# Patient Record
Sex: Female | Born: 1945 | Race: White | Hispanic: No | Marital: Married | Smoking: Never smoker
Health system: Southern US, Community
[De-identification: ages and names within clinical notes are randomized; demographics above are authoritative.]

## PROBLEM LIST (undated history)

## (undated) DIAGNOSIS — C801 Malignant (primary) neoplasm, unspecified: Secondary | ICD-10-CM

---

## 2015-07-13 ENCOUNTER — Encounter (HOSPITAL_BASED_OUTPATIENT_CLINIC_OR_DEPARTMENT_OTHER): Payer: Self-pay

## 2015-07-13 ENCOUNTER — Emergency Department (HOSPITAL_BASED_OUTPATIENT_CLINIC_OR_DEPARTMENT_OTHER): Payer: Medicare Other

## 2015-07-13 ENCOUNTER — Emergency Department (HOSPITAL_BASED_OUTPATIENT_CLINIC_OR_DEPARTMENT_OTHER)
Admission: EM | Admit: 2015-07-13 | Discharge: 2015-07-13 | Disposition: A | Discharge status: Home / Self Care | Payer: Medicare Other | Attending: Emergency Medicine | Admitting: Emergency Medicine

## 2015-07-13 DIAGNOSIS — M79602 Pain in left arm: Secondary | ICD-10-CM | POA: Diagnosis not present

## 2015-07-13 DIAGNOSIS — R0789 Other chest pain: Secondary | ICD-10-CM | POA: Insufficient documentation

## 2015-07-13 DIAGNOSIS — R42 Dizziness and giddiness: Secondary | ICD-10-CM | POA: Diagnosis not present

## 2015-07-13 DIAGNOSIS — R079 Chest pain, unspecified: Secondary | ICD-10-CM

## 2015-07-13 DIAGNOSIS — R002 Palpitations: Secondary | ICD-10-CM | POA: Diagnosis not present

## 2015-07-13 DIAGNOSIS — R0602 Shortness of breath: Secondary | ICD-10-CM | POA: Diagnosis not present

## 2015-07-13 LAB — BASIC METABOLIC PANEL
Anion gap: 9 (ref 5–15)
BUN: 21 mg/dL — ABNORMAL HIGH (ref 6–20)
CHLORIDE: 104 mmol/L (ref 101–111)
CO2: 26 mmol/L (ref 22–32)
Calcium: 9.1 mg/dL (ref 8.9–10.3)
Creatinine, Ser: 0.74 mg/dL (ref 0.44–1.00)
GFR calc Af Amer: >60 mL/min (ref >60)
Glucose, Bld: 106 mg/dL — ABNORMAL HIGH (ref 65–99)
POTASSIUM: 3.7 mmol/L (ref 3.5–5.1)
SODIUM: 139 mmol/L (ref 135–145)

## 2015-07-13 LAB — CBC
HEMATOCRIT: 33.6 % — AB (ref 36.0–46.0)
HEMOGLOBIN: 11.2 g/dL — AB (ref 12.0–15.0)
MCH: 31.8 pg (ref 26.0–34.0)
MCHC: 33.3 g/dL (ref 30.0–36.0)
MCV: 95.5 fL (ref 78.0–100.0)
Platelets: 349 10*3/uL (ref 150–400)
RBC: 3.52 MIL/uL — AB (ref 3.87–5.11)
RDW: 12.7 % (ref 11.5–15.5)
WBC: 5.7 10*3/uL (ref 4.0–10.5)

## 2015-07-13 LAB — URINALYSIS, ROUTINE W REFLEX MICROSCOPIC
BILIRUBIN URINE: NEGATIVE
Glucose, UA: NEGATIVE mg/dL
Hgb urine dipstick: NEGATIVE
Ketones, ur: NEGATIVE mg/dL
LEUKOCYTES UA: NEGATIVE
NITRITE: NEGATIVE
PH: 5 (ref 5.0–8.0)
Protein, ur: NEGATIVE mg/dL
SPECIFIC GRAVITY, URINE: 1.018 (ref 1.005–1.030)

## 2015-07-13 LAB — TROPONIN I: Troponin I: <0.03 ng/mL (ref <0.031)

## 2015-07-13 LAB — D-DIMER, QUANTITATIVE (NOT AT ARMC): D DIMER QUANT: 0.43 ug{FEU}/mL (ref 0.00–0.50)

## 2015-07-13 NOTE — ED Notes (Signed)
Patient transported to X-ray 

## 2015-07-13 NOTE — ED Notes (Signed)
Patient here with chest heaviness and feeling like she has indigestion since Thursday. Seen at urgent care and reports normal EKG, saw cardiologist and to have stress test 3/17, patient concerned that the heaviness continues and palpitations with exercise, she has never had palpitations in past

## 2015-07-13 NOTE — Discharge Instructions (Signed)
1. Medications: usual home medications 2. Treatment: rest, drink plenty of fluids 3. Follow Up: please followup with your cardiologist (call Monday) for discussion of your diagnoses and further evaluation after today's visit; if you do not have a primary care doctor use the resource guide provided to find one; please return to the ER for new or worsening symptoms   Chest Pain Observation It is often hard to give a specific diagnosis for the cause of chest pain. Among other possibilities your symptoms might be caused by inadequate oxygen delivery to your heart (angina). Angina that is not treated or evaluated can lead to a heart attack (myocardial infarction) or death. Blood tests, electrocardiograms, and X-rays may have been done to help determine a possible cause of your chest pain. After evaluation and observation, your health care provider has determined that it is unlikely your pain was caused by an unstable condition that requires hospitalization. However, a full evaluation of your pain may need to be completed, with additional diagnostic testing as directed. It is very important to keep your follow-up appointments. Not keeping your follow-up appointments could result in permanent heart damage, disability, or death. If there is any problem keeping your follow-up appointments, you must call your health care provider. HOME CARE INSTRUCTIONS  Due to the slight chance that your pain could be angina, it is important to follow your health care provider's treatment plan and also maintain a healthy lifestyle:  Maintain or work toward achieving a healthy weight.  Stay physically active and exercise regularly.  Decrease your salt intake.  Eat a balanced, healthy diet. Talk to a dietitian to learn about heart-healthy foods.  Increase your fiber intake by including whole grains, vegetables, fruits, and nuts in your diet.  Avoid situations that cause stress, anger, or depression.  Take medicines as  advised by your health care provider. Report any side effects to your health care provider. Do not stop medicines or adjust the dosages on your own.  Quit smoking. Do not use nicotine patches or gum until you check with your health care provider.  Keep your blood pressure, blood sugar, and cholesterol levels within normal limits.  Limit alcohol intake to no more than 1 drink per day for women who are not pregnant and 2 drinks per day for men.  Do not abuse drugs. SEEK IMMEDIATE MEDICAL CARE IF: You have severe chest pain or pressure which may include symptoms such as:  You feel pain or pressure in your arms, neck, jaw, or back.  You have severe back or abdominal pain, feel sick to your stomach (nauseous), or throw up (vomit).  You are sweating profusely.  You are having a fast or irregular heartbeat.  You feel short of breath while at rest.  You notice increasing shortness of breath during rest, sleep, or with activity.  You have chest pain that does not get better after rest or after taking your usual medicine.  You wake from sleep with chest pain.  You are unable to sleep because you cannot breathe.  You develop a frequent cough or you are coughing up blood.  You feel dizzy, faint, or experience extreme fatigue.  You develop severe weakness, dizziness, fainting, or chills. Any of these symptoms may represent a serious problem that is an emergency. Do not wait to see if the symptoms will go away. Call your local emergency services (911 in the [place] Do not drive yourself to the hospital. MAKE SURE YOU:  Understand these instructions.  Will watch  your condition.  Will get help right away if you are not doing well or get worse.   This information is not intended to replace advice given to you by your health care provider. Make sure you discuss any questions you have with your health care provider.   Document Released: 06/06/2010 Document Revised: 05/09/2013 Document  Reviewed: 11/03/2012 Elsevier Interactive Patient Education [employer].

## 2015-07-13 NOTE — ED Notes (Signed)
Dr. Tamera Punt in room with patient now.

## 2015-07-13 NOTE — ED Provider Notes (Signed)
CSN: [id number]     Arrival date & time 07/13/15  0945 History   First MD Initiated Contact with Patient 07/13/15 0957     Chief Complaint  Patient presents with  . Chest Pain    HPI   Sophia Atkinson is a 70 y.o. female with no pertinent PMH who presents to the ED with chest pain, which she states has been intermittent for the past week. She notes associated left arm pain. She characterizes her pain as aching. She notes pressing on her chest and on her arm exacerbate her symptoms. She has not tried anything for symptom relief. She also reports an episode of dizziness this morning, now resolved, as well as shortness of breath with exertion. She states she has been lifting heavier weights recently, and initially attributed her pain to this, however was evaluated by urgent care on Thursday, and was advised to follow-up with cardiology. She states she saw a cardiologist yesterday (Dr. Jimmie Molly) and is scheduled to have a stress test in March. She denies fever, chills, nasal congestion, cough, abdominal pain, N/V/D/C, numbness, weakness, paresthesia, recent travel or immobility, recent surgery, history of DVT/PE, history of malignancy, estrogen use.   History reviewed. No pertinent past medical history. History reviewed. No pertinent past surgical history. No family history on file. Social History  Substance Use Topics  . Smoking status: Never Smoker   . Smokeless tobacco: None  . Alcohol Use: None   OB History    No data available      Review of Systems  Constitutional: Negative for fever and chills.  HENT: Negative for congestion.   Respiratory: Positive for shortness of breath. Negative for cough.   Cardiovascular: Positive for chest pain and palpitations. Negative for leg swelling.  Gastrointestinal: Negative for nausea, vomiting, abdominal pain, diarrhea and constipation.  Neurological: Positive for dizziness. Negative for syncope, weakness and numbness.  All other systems reviewed  and are negative.     Allergies  Review of patient's allergies indicates no known allergies.  Home Medications   Prior to Admission medications   Not on File    BP 134/92 mmHg  Pulse 75  Temp(Src) 98 F (36.7 C) (Oral)  Resp 17  Ht 5\' 3"  (1.6 m)  Wt 62.596 kg  BMI 24.45 kg/m2  SpO2 99% Physical Exam  Constitutional: She is oriented to person, place, and time. She appears well-developed and well-nourished. No distress.  HENT:  Head: Normocephalic and atraumatic.  Right Ear: External ear normal.  Left Ear: External ear normal.  Nose: Nose normal.  Mouth/Throat: Uvula is midline, oropharynx is clear and moist and mucous membranes are normal.  Eyes: Conjunctivae, EOM and lids are normal. Pupils are equal, round, and reactive to light. Right eye exhibits no discharge. Left eye exhibits no discharge. No scleral icterus.  Neck: Normal range of motion. Neck supple.  Cardiovascular: Normal rate, regular rhythm, normal heart sounds, intact distal pulses and normal pulses.   Pulmonary/Chest: Effort normal and breath sounds normal. No respiratory distress. She has no wheezes. She has no rales. She exhibits tenderness.  Mild TTP of left chest wall.  Abdominal: Soft. Normal appearance and bowel sounds are normal. She exhibits no distension and no mass. There is no tenderness. There is no rigidity, no rebound and no guarding.  Musculoskeletal: Normal range of motion. She exhibits no edema or tenderness.  Neurological: She is alert and oriented to person, place, and time. She has normal strength. No sensory deficit.  Skin: Skin is warm,  dry and intact. No rash noted. She is not diaphoretic. No erythema. No pallor.  Psychiatric: She has a normal mood and affect. Her speech is normal and behavior is normal.  Nursing note and vitals reviewed.   ED Course  Procedures (including critical care time)  Labs Review Labs Reviewed  BASIC METABOLIC PANEL - Abnormal; Notable for the following:     Glucose, Bld 106 (*)    BUN 21 (*)    All other components within normal limits  CBC - Abnormal; Notable for the following:    RBC 3.52 (*)    Hemoglobin 11.2 (*)    HCT 33.6 (*)    All other components within normal limits  TROPONIN I  URINALYSIS, ROUTINE W REFLEX MICROSCOPIC (NOT AT [facility])  D-DIMER, QUANTITATIVE (NOT AT [facility])  TROPONIN I    Imaging Review Dg Chest 2 View  07/13/2015  CLINICAL DATA:  Chest heaviness, dizziness and shortness of breath. EXAM: CHEST  2 VIEW COMPARISON:  None. FINDINGS: Lungs are clear without airspace disease or pulmonary edema. Heart and mediastinum are within normal limits. The trachea is midline. Mild degenerative endplate changes in thoracic spine. No pleural effusions. IMPRESSION: No active cardiopulmonary disease. Electronically Signed   By: Markus Daft M.D.   On: 07/13/2015 10:43   I have personally reviewed and evaluated these images and lab results as part of my medical decision-making.   EKG Interpretation   Date/Time:  Saturday July 13 2015 10:08:48 EST Ventricular Rate:  69 PR Interval:  132 QRS Duration: 85 QT Interval:  388 QTC Calculation: 416 R Axis:   76 Text Interpretation:  Sinus rhythm No old tracing to compare Confirmed by  BELFI  MD, MELANIE ([id number]) on 07/13/2015 10:11:16 AM      MDM   Final diagnoses:  Chest pain, unspecified chest pain type    70 year old female presents with chest pain. Also notes left arm pain and one episode of dizziness today prior to arrival and shortness of breath while exercising. Was seen by cardiology yesterday, and has an outpatient stress test ordered for March.   Patient is afebrile. Vital signs stable. Heart RRR. Lungs clear bilaterally. Left anterior chest wall TTP. Abdomen soft, non-tender, non-distended. No lower extremity edema.   EKG sinus rhythm, HR 69. Troponin negative. D-dimer negative. CBC remarkable for hemoglobin 11.2. BMP unremarkable. UA negative for infection. Chest  x-ray no active cardiopulmonary disease.   Discussed admission with patient, who states she does not want to be admitted and will call her cardiologist on Monday for follow-up. Patient currently asymptomatic. Will obtain delta troponin.  Patient discussed with and seen by Dr. Tamera Punt.   Delta troponin negative. Patient continues to express her desire to be discharged home, and understands the risks of not being admitted to the hospital for further evaluation and management of symptoms. Strict return precautions discussed. Patient to call cardiologist on Monday for follow-up. Patient verbalizes her understanding and is in agreement with plan.  BP 134/92 mmHg  Pulse 75  Temp(Src) 98 F (36.7 C) (Oral)  Resp 17  Ht 5\' 3"  (1.6 m)  Wt 62.596 kg  BMI 24.45 kg/m2  SpO2 99%     Marella Chimes, PA-C 07/13/15 [place], MD 07/14/15 [phone]

## 2015-07-13 NOTE — ED Notes (Addendum)
Pt seen by cardiologist yesterday for chest pain that started earlier in the week.  Stress test advised for future appointment.  This morning, pt states she felt dizzy and felt palpatations again while doing normal activity.  Pt c/o upper left chest pain that occasionally "aches" and is reproducible with palpation.

## 2016-06-24 ENCOUNTER — Emergency Department (HOSPITAL_BASED_OUTPATIENT_CLINIC_OR_DEPARTMENT_OTHER): Payer: Medicare Other

## 2016-06-24 ENCOUNTER — Encounter (HOSPITAL_BASED_OUTPATIENT_CLINIC_OR_DEPARTMENT_OTHER): Payer: Self-pay

## 2016-06-24 ENCOUNTER — Emergency Department (HOSPITAL_BASED_OUTPATIENT_CLINIC_OR_DEPARTMENT_OTHER)
Admission: EM | Admit: 2016-06-24 | Discharge: 2016-06-24 | Disposition: A | Discharge status: Home / Self Care | Payer: Medicare Other | Attending: Emergency Medicine | Admitting: Emergency Medicine

## 2016-06-24 DIAGNOSIS — W1839XA Other fall on same level, initial encounter: Secondary | ICD-10-CM | POA: Diagnosis not present

## 2016-06-24 DIAGNOSIS — Y939 Activity, unspecified: Secondary | ICD-10-CM | POA: Insufficient documentation

## 2016-06-24 DIAGNOSIS — S8991XA Unspecified injury of right lower leg, initial encounter: Secondary | ICD-10-CM | POA: Diagnosis not present

## 2016-06-24 DIAGNOSIS — M25461 Effusion, right knee: Secondary | ICD-10-CM | POA: Diagnosis not present

## 2016-06-24 DIAGNOSIS — Y9239 Other specified sports and athletic area as the place of occurrence of the external cause: Secondary | ICD-10-CM | POA: Diagnosis not present

## 2016-06-24 DIAGNOSIS — Y999 Unspecified external cause status: Secondary | ICD-10-CM | POA: Diagnosis not present

## 2016-06-24 MED ORDER — HYDROCODONE-ACETAMINOPHEN 5-325 MG PO TABS: 1.0000 | ORAL_TABLET | Freq: Four times a day (QID) | ORAL | 0 refills | Status: DC | PRN

## 2016-06-24 NOTE — ED Notes (Signed)
Pt declines ice pack for knee

## 2016-06-24 NOTE — ED Notes (Signed)
Pt teaching provided on medications that may cause drowsiness. Pt instructed not to drive or operate heavy machinery while taking the prescribed medication. Pt verbalized understanding.   

## 2016-06-24 NOTE — ED Provider Notes (Signed)
[facility] Provider Note   CSN: [id number] Arrival date & time: 06/24/16  [id number]     History   Chief Complaint No chief complaint on file.   HPI Jackelinne Pruiett is a 71 y.o. female.  Patient presents to the emergency department with chief complaint of right knee pain. She states that she was at the [facility] yesterday, and her shoes stuck to the floor, causing her to fall to the ground landing on bilateral knees. She complains of more pain on the right knee than on the left. She states that she had swelling of the right knee yesterday, which has lasted into today, but is gradually improving. States that her left knee feels slightly sore, but is otherwise much better. She is mainly concerned about her right knee today. She states that she has had limited range of motion because of the pain. She has had to ambulate with a cane. She has used a knee sleeve for comfort. She denies any other injuries.   The history is provided by the patient. No language interpreter was used.    No past medical history on file.  There are no active problems to display for this patient.   No past surgical history on file.  OB History    No data available       Home Medications    Prior to Admission medications   Not on File    Family History No family history on file.  Social History Social History  Substance Use Topics  . Smoking status: Never Smoker  . Smokeless tobacco: Not on file  . Alcohol use Not on file     Allergies   Patient has no known allergies.   Review of Systems Review of Systems  Constitutional: Negative for chills and fever.  Musculoskeletal: Positive for arthralgias, gait problem and joint swelling.  Skin: Negative for color change and wound.  Neurological: Negative for weakness and numbness.     Physical Exam Updated Vital Signs BP 165/94 (BP Location: Right Arm)   Pulse 77   Temp 98.6 F (37 C) (Oral)   Resp 18   Ht 5\' 3"  (1.6 m)   Wt 57.6 kg    SpO2 100%   BMI 22.50 kg/m   Physical Exam Nursing note and vitals reviewed.  Constitutional: Pt appears well-developed and well-nourished. No distress.  HENT:  Head: Normocephalic and atraumatic.  Eyes: Conjunctivae are normal.  Neck: Normal range of motion.  Cardiovascular: Normal rate, regular rhythm. Intact distal pulses.   Capillary refill < 3 sec.  Pulmonary/Chest: Effort normal and breath sounds normal.  Musculoskeletal:  Left Knee: No tenderness, abnormality or deformity, ROM/strength 5/5 Right Knee: Pt exhibits tenderness to palpation anteriorly and superiorly, no bony abnormality or deformity.   ROM: 4/5 limited by pain  Strength: 4/5 limited by pain  Neurological: Pt  is alert. Coordination normal.  Sensation: 5/5 Skin: Skin is warm and dry. Pt is not diaphoretic.  No evidence of open wound or skin tenting Psychiatric: Pt has a normal mood and affect.     ED Treatments / Results  Labs (all labs ordered are listed, but only abnormal results are displayed) Labs Reviewed - No data to display  EKG  EKG Interpretation None       Radiology Dg Knee Complete 4 Views Right  Result Date: 06/24/2016 CLINICAL DATA:  Fall yesterday. Hit right knee on concrete. Anterior knee pain with swelling. Unable to bear weight. EXAM: RIGHT KNEE - COMPLETE 4+  VIEW COMPARISON:  None. FINDINGS: The right knee is located. A large joint effusion is present. There is no underlying fracture. Moderate osteopenia is present. IMPRESSION: 1. Large right knee effusion.  Internal derangement is not excluded. 2. No definite fracture. 3. Moderate osteopenia. Electronically Signed   By: San Morelle M.D.   On: 06/24/2016 10:10    Procedures Procedures (including critical care time)  Medications Ordered in ED Medications - No data to display   Initial Impression / Assessment and Plan / ED Course  I have reviewed the triage vital signs and the nursing notes.  Pertinent labs &  imaging results that were available during my care of the patient were reviewed by me and considered in my medical decision making (see chart for details).     Patient X-Ray negative for obvious fracture or dislocation.  There is a large knee effusion, and internal derangement cannot be excluded. Concern for anterior cruciate ligament injury given hyperextension mechanism. Pt advised to follow up with orthopedics. Patient given knee immobilizer while in ED, conservative therapy recommended and discussed. Patient will be discharged home & is agreeable with above plan. Returns precautions discussed. Pt appears safe for discharge.   Final Clinical Impressions(s) / ED Diagnoses   Final diagnoses:  Injury of right knee, initial encounter  Effusion of right knee    New Prescriptions New Prescriptions   HYDROCODONE-ACETAMINOPHEN (NORCO/VICODIN) 5-325 MG TABLET    Take 1 tablet by mouth every 6 (six) hours as needed.     Montine Circle, PA-C 06/24/16 [place], MD 06/24/16 1115

## 2016-06-24 NOTE — ED Triage Notes (Signed)
Pt had a fall yesterday while at the gym. Pt landed on both knees. Pt c/o pain and swelling to bilateral knees R>L.

## 2016-06-30 ENCOUNTER — Encounter: Payer: Self-pay | Admitting: Family Medicine

## 2016-06-30 ENCOUNTER — Ambulatory Visit (INDEPENDENT_AMBULATORY_CARE_PROVIDER_SITE_OTHER): Payer: Medicare Other | Admitting: Family Medicine

## 2016-06-30 DIAGNOSIS — S8991XA Unspecified injury of right lower leg, initial encounter: Secondary | ICD-10-CM

## 2016-06-30 NOTE — Progress Notes (Signed)
PCP: Pcp Not In System  Subjective:   HPI: Patient is a 71 y.o. female here for bilateral knee injury.  Patient reports on 2/6 she accidentally fell forward and landed directly on both knees. Right was worse than the left. Had difficulty bearing weight. Significant swelling when this happened. She is very active, works out 6 days a week. X-rays negative for fracture in ER. Pain now 0/10 level and swelling much better. No skin changes, numbness. Wearing immobilizer.  No past medical history on file.  No current outpatient prescriptions on file prior to visit.   No current facility-administered medications on file prior to visit.     No past surgical history on file.  No Known Allergies  Social History   Social History  . Marital status: Married    Spouse name: N/A  . Number of children: N/A  . Years of education: N/A   Occupational History  . Not on file.   Social History Main Topics  . Smoking status: Never Smoker  . Smokeless tobacco: Never Used  . Alcohol use 8.4 oz/week    14 Glasses of wine per week  . Drug use: No  . Sexual activity: Not on file   Other Topics Concern  . Not on file   Social History Narrative  . No narrative on file    No family history on file.  BP 114/80   Pulse 99   Ht 5\' 3"  (1.6 m)   Wt 125 lb (56.7 kg)   BMI 22.14 kg/m   Review of Systems: See HPI above.     Objective:  Physical Exam:  Gen: NAD, comfortable in exam room  Right knee: Small effusion.  No other gross deformity, ecchymoses. Minimal TTP medial, lateral joint lines.  No other tenderness. FROM. Negative ant/post drawers. Negative valgus/varus testing. Negative lachmanns. Mild pain with mcmurrays, apleys.  Negative patellar apprehension. NV intact distally.  Left knee: FROM without pain.   Assessment & Plan:  1. Right knee injury - independently reviewed radiographs and no evidence fracture, other bony abnormalities.  She does have effusion that  clinically is much better than was in ED.  Exam is reassuring as well as mechanism of injury - likely a contusion but discussed meniscus tear as less likely possibility.  Switch to hinged knee brace.  Icing, elevation.  Ibuprofen or aleve only if needed.  F/u in 5 weeks.  Avoid squats, lunges, leg press, twisting motions.

## 2016-06-30 NOTE — Patient Instructions (Addendum)
You have a knee contusion, less likely a meniscus tear. Switch to a hinged knee brace - wear when up and walking around for the next 5 weeks. Icing 15 minutes at a time 3-4 times a day until swelling has resolved. Elevate above your heart when possible. I would avoid deep squats, deep lunges, leg press, anything that involves twisting for the next 5 weeks. Could take ibuprofen or aleve if needed but try to minimize use of this. Follow up with me in 5 weeks but call me if you have any questions or concerns.

## 2016-07-01 DIAGNOSIS — S8991XD Unspecified injury of right lower leg, subsequent encounter: Secondary | ICD-10-CM | POA: Insufficient documentation

## 2016-07-01 NOTE — Assessment & Plan Note (Signed)
independently reviewed radiographs and no evidence fracture, other bony abnormalities.  She does have effusion that clinically is much better than was in ED.  Exam is reassuring as well as mechanism of injury - likely a contusion but discussed meniscus tear as less likely possibility.  Switch to hinged knee brace.  Icing, elevation.  Ibuprofen or aleve only if needed.  F/u in 5 weeks.  Avoid squats, lunges, leg press, twisting motions.

## 2016-07-08 ENCOUNTER — Ambulatory Visit (INDEPENDENT_AMBULATORY_CARE_PROVIDER_SITE_OTHER): Payer: Self-pay | Admitting: Orthopedic Surgery

## 2016-08-04 ENCOUNTER — Encounter: Payer: Self-pay | Admitting: Family Medicine

## 2016-08-04 ENCOUNTER — Ambulatory Visit (INDEPENDENT_AMBULATORY_CARE_PROVIDER_SITE_OTHER): Payer: Medicare Other | Admitting: Family Medicine

## 2016-08-04 DIAGNOSIS — S8991XD Unspecified injury of right lower leg, subsequent encounter: Secondary | ICD-10-CM

## 2016-08-04 NOTE — Patient Instructions (Signed)
I would wear the sleeve when working out for another 4-6 weeks. Icing, tylenol only if needed. No restrictions otherwise. Follow up with me as needed.

## 2016-08-06 NOTE — Assessment & Plan Note (Signed)
2/2 contusion.  Clinically improved.  X-rays were negative.  Icing, tylenol if needed.  Sleeve when working out.  No restrictions.  f/u prn.

## 2016-08-06 NOTE — Progress Notes (Signed)
PCP: Augusto Garbe, MD  Subjective:   HPI: Patient is a 71 y.o. female here for bilateral knee injury.  2/13: Patient reports on 2/6 she accidentally fell forward and landed directly on both knees. Right was worse than the left. Had difficulty bearing weight. Significant swelling when this happened. She is very active, works out 6 days a week. X-rays negative for fracture in ER. Pain now 0/10 level and swelling much better. No skin changes, numbness. Wearing immobilizer.  3/20: Patient reports she feels completely better. Able to work out every day. No pain, swelling, numbness, skin changes. No limitations on activities.  No past medical history on file.  Current Outpatient Prescriptions on File Prior to Visit  Medication Sig Dispense Refill  . DOCOSAHEXAENOIC ACID PO Take by mouth.    . Multiple Vitamins-Minerals (MULTIVITAMIN WITH MINERALS) tablet Take by mouth.    . vitamin C (ASCORBIC ACID) 500 MG tablet Take 500 mg by mouth.     No current facility-administered medications on file prior to visit.     No past surgical history on file.  No Known Allergies  Social History   Social History  . Marital status: Married    Spouse name: N/A  . Number of children: N/A  . Years of education: N/A   Occupational History  . Not on file.   Social History Main Topics  . Smoking status: Never Smoker  . Smokeless tobacco: Never Used  . Alcohol use 8.4 oz/week    14 Glasses of wine per week  . Drug use: No  . Sexual activity: Not on file   Other Topics Concern  . Not on file   Social History Narrative  . No narrative on file    No family history on file.  BP (!) 144/87   Pulse 75   Ht 5\' 3"  (1.6 m)   Wt 122 lb (55.3 kg)   BMI 21.61 kg/m   Review of Systems: See HPI above.     Objective:  Physical Exam:  Gen: NAD, comfortable in exam room  Right knee: No effusion, deformity, ecchymoses. No TTP. FROM. Negative ant/post drawers. Negative  valgus/varus testing. Negative lachmanns. Negative mcmurrays, apleys.  Negative patellar apprehension. NV intact distally.  Left knee: FROM without pain.   Assessment & Plan:  1. Right knee injury - 2/2 contusion.  Clinically improved.  X-rays were negative.  Icing, tylenol if needed.  Sleeve when working out.  No restrictions.  f/u prn.

## 2017-02-13 IMAGING — DX DG CHEST 2V
2 series · 2 of 2 positions shown · non-contrast
Comparison: None.

CLINICAL DATA: Chest heaviness, dizziness and shortness of breath.

EXAM:
CHEST  2 VIEW

[chest pa]
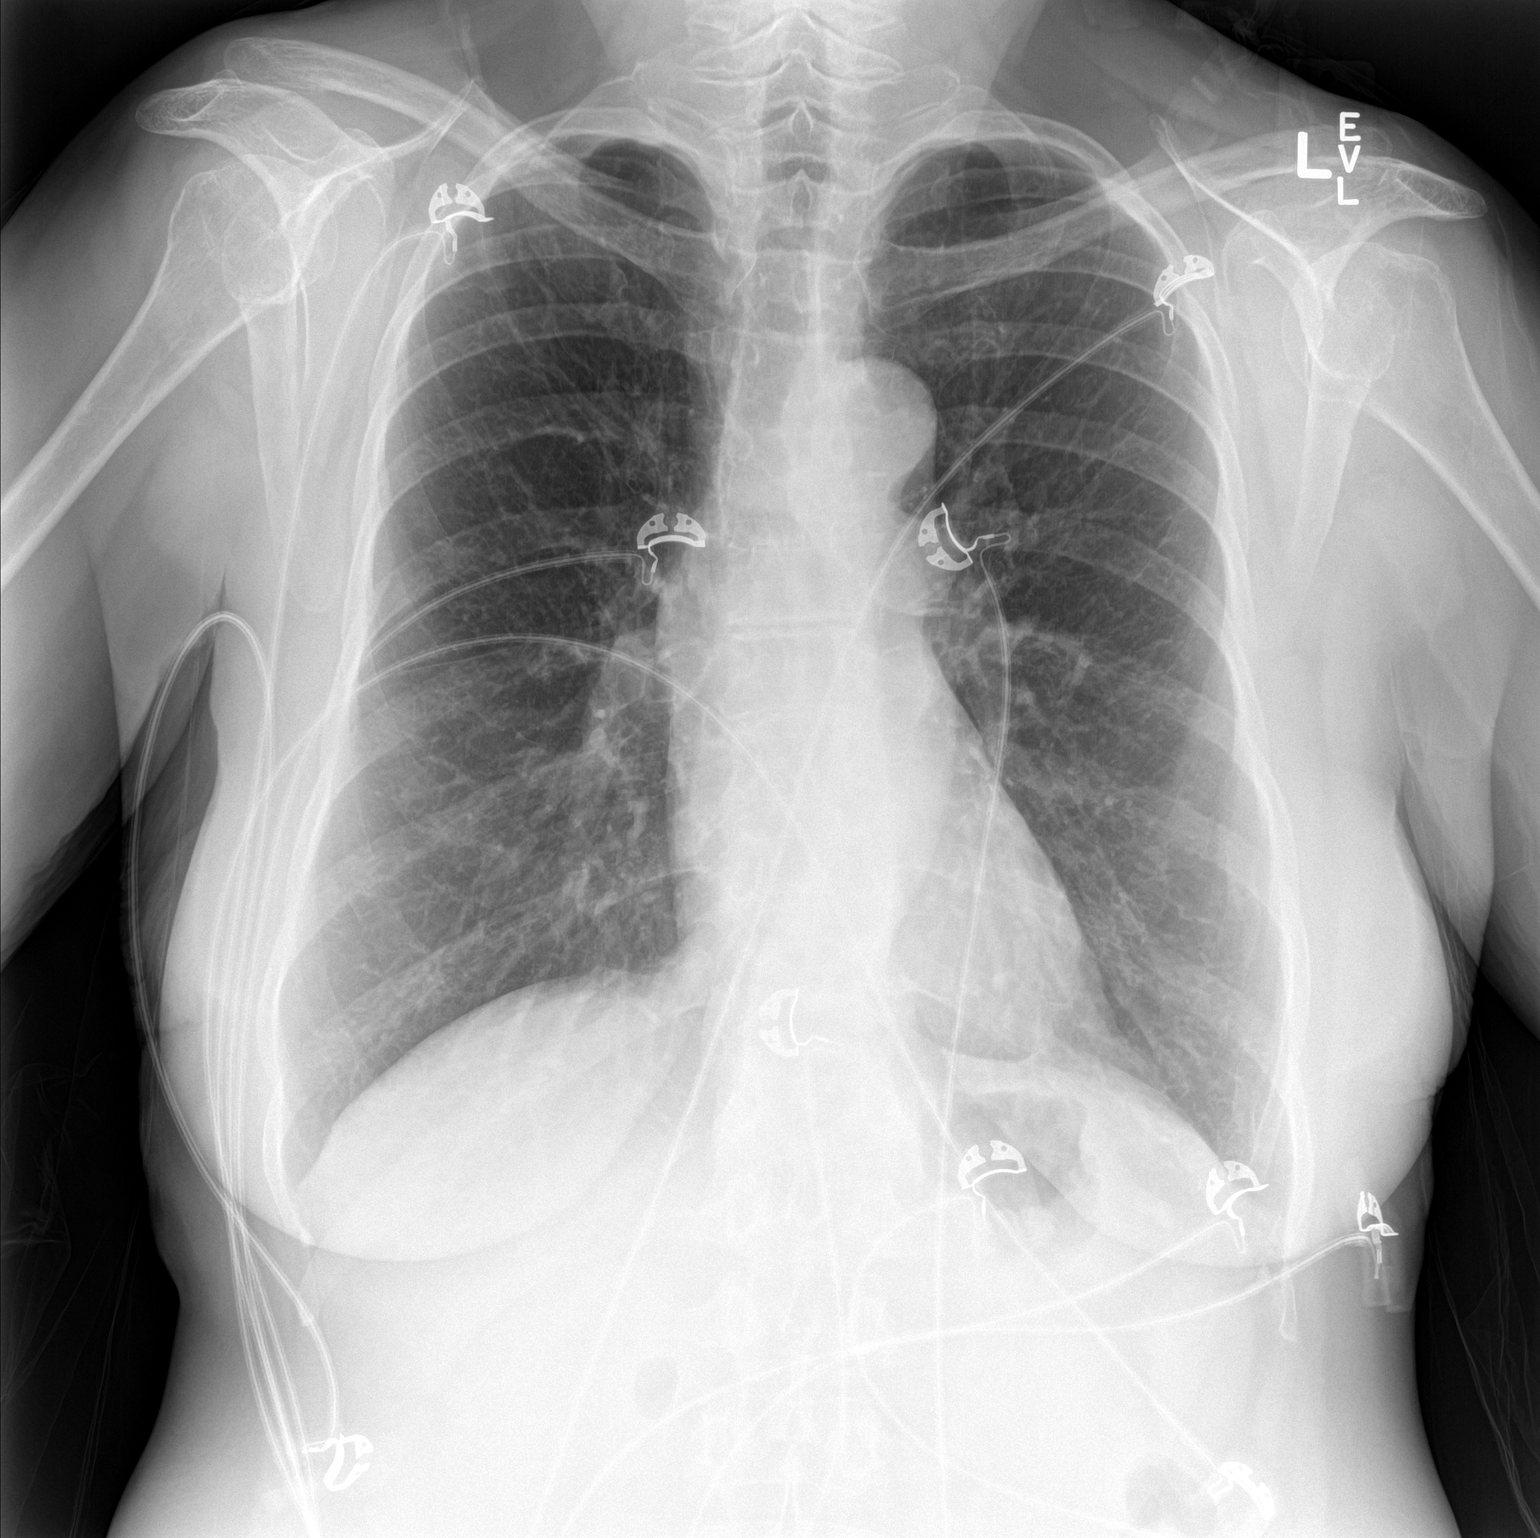

[chest lat]
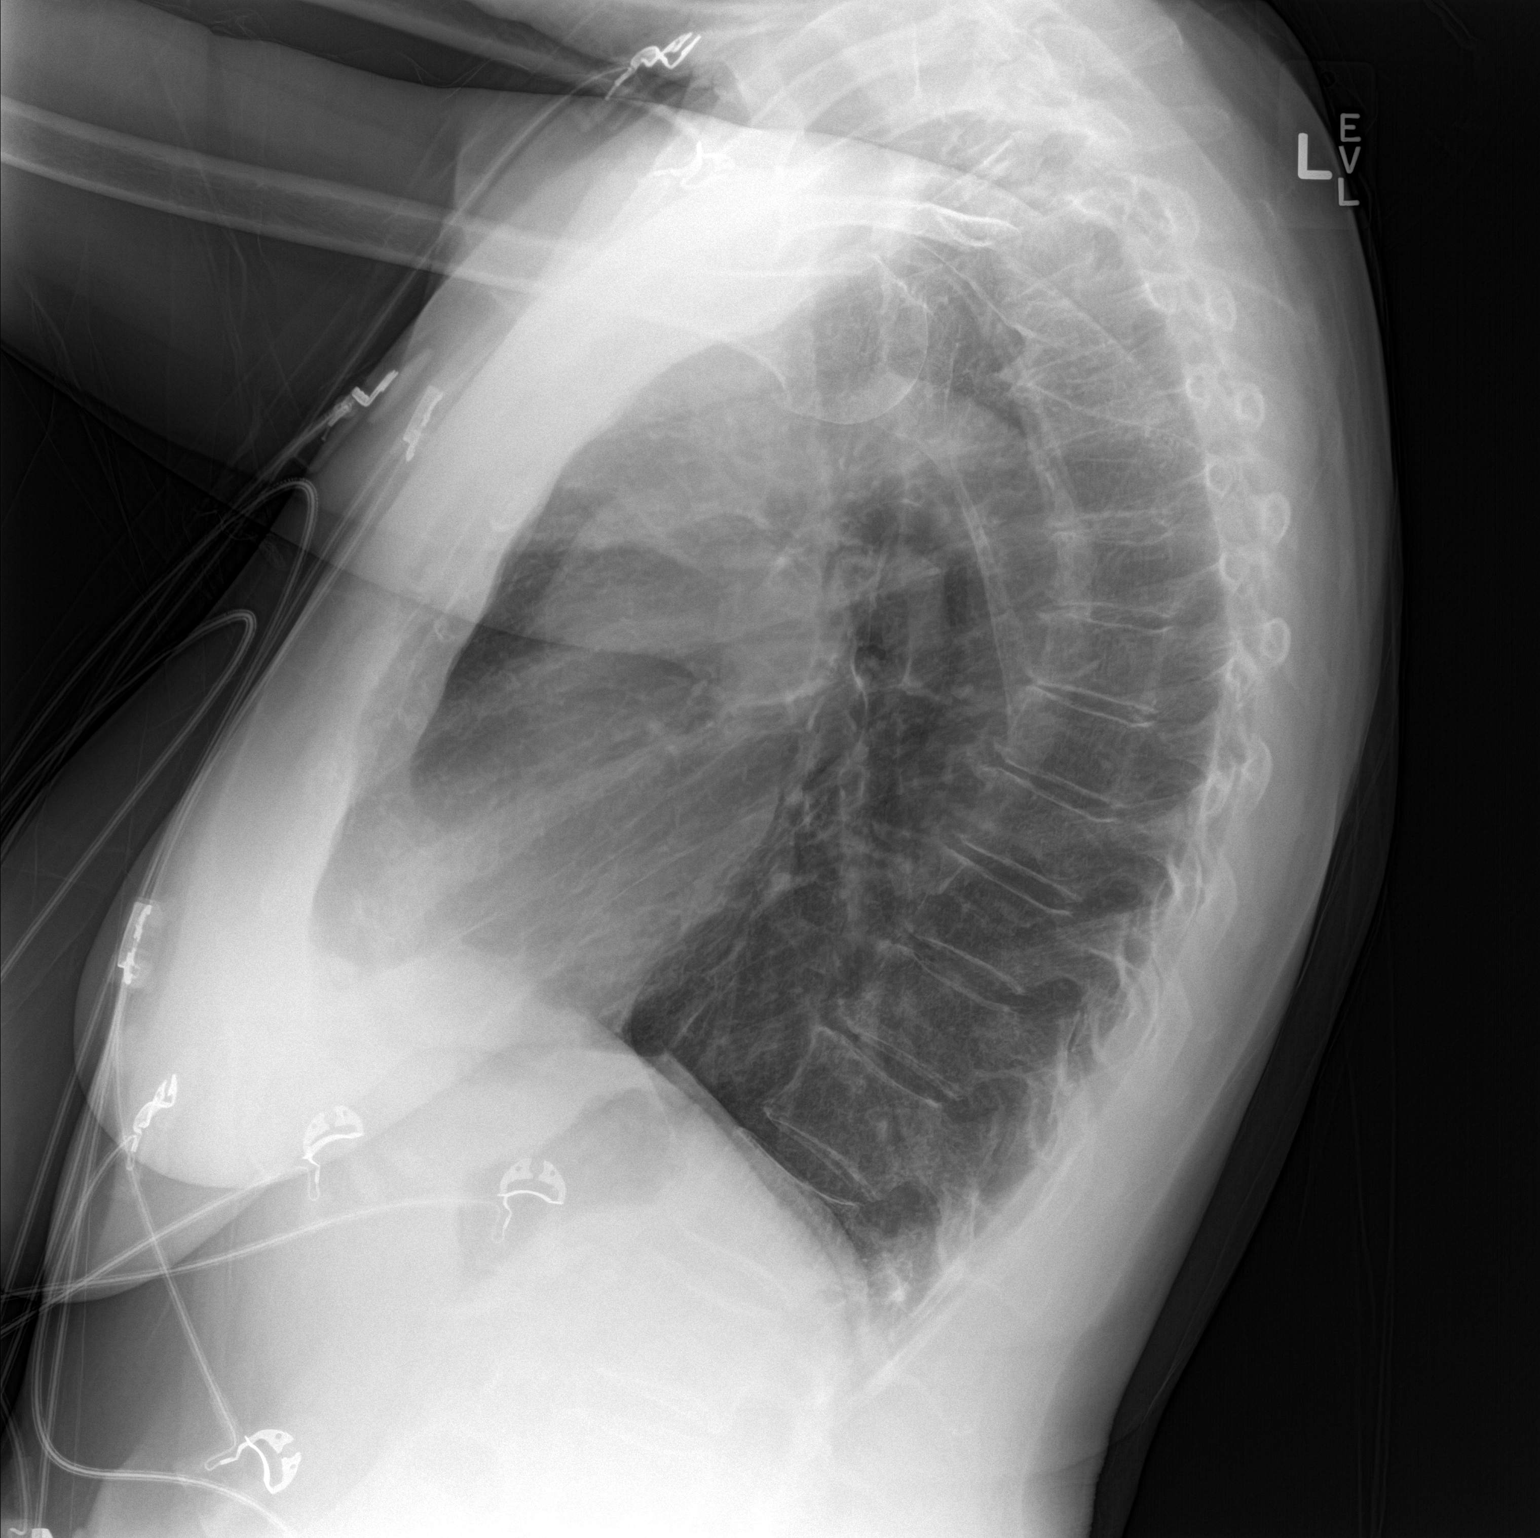

[2 of 2 positions shown; findings below may reference images not displayed]

FINDINGS: Lungs are clear without airspace disease or pulmonary edema. Heart
and mediastinum are within normal limits. The trachea is midline.
Mild degenerative endplate changes in thoracic spine. No pleural
effusions.
IMPRESSION: No active cardiopulmonary disease.

## 2017-08-22 ENCOUNTER — Other Ambulatory Visit: Payer: Self-pay

## 2017-08-22 ENCOUNTER — Encounter (HOSPITAL_BASED_OUTPATIENT_CLINIC_OR_DEPARTMENT_OTHER): Payer: Self-pay | Admitting: Emergency Medicine

## 2017-08-22 ENCOUNTER — Emergency Department (HOSPITAL_BASED_OUTPATIENT_CLINIC_OR_DEPARTMENT_OTHER): Payer: Medicare Other

## 2017-08-22 ENCOUNTER — Emergency Department (HOSPITAL_BASED_OUTPATIENT_CLINIC_OR_DEPARTMENT_OTHER)
Admission: EM | Admit: 2017-08-22 | Discharge: 2017-08-22 | Disposition: A | Discharge status: Home / Self Care | Payer: Medicare Other | Attending: Emergency Medicine | Admitting: Emergency Medicine

## 2017-08-22 DIAGNOSIS — R21 Rash and other nonspecific skin eruption: Secondary | ICD-10-CM

## 2017-08-22 DIAGNOSIS — B028 Zoster with other complications: Secondary | ICD-10-CM | POA: Diagnosis not present

## 2017-08-22 DIAGNOSIS — R109 Unspecified abdominal pain: Secondary | ICD-10-CM | POA: Insufficient documentation

## 2017-08-22 DIAGNOSIS — Z79899 Other long term (current) drug therapy: Secondary | ICD-10-CM | POA: Diagnosis not present

## 2017-08-22 LAB — LIPASE, BLOOD: LIPASE: 36 U/L (ref 11–51)

## 2017-08-22 LAB — COMPREHENSIVE METABOLIC PANEL
ALT: 13 U/L — ABNORMAL LOW (ref 14–54)
AST: 21 U/L (ref 15–41)
Albumin: 4.1 g/dL (ref 3.5–5.0)
Alkaline Phosphatase: 36 U/L — ABNORMAL LOW (ref 38–126)
Anion gap: 10 (ref 5–15)
BILIRUBIN TOTAL: 0.7 mg/dL (ref 0.3–1.2)
BUN: 21 mg/dL — AB (ref 6–20)
CO2: 24 mmol/L (ref 22–32)
CREATININE: 0.76 mg/dL (ref 0.44–1.00)
Calcium: 9.5 mg/dL (ref 8.9–10.3)
Chloride: 104 mmol/L (ref 101–111)
Glucose, Bld: 118 mg/dL — ABNORMAL HIGH (ref 65–99)
POTASSIUM: 3.8 mmol/L (ref 3.5–5.1)
Sodium: 138 mmol/L (ref 135–145)
TOTAL PROTEIN: 7.4 g/dL (ref 6.5–8.1)

## 2017-08-22 LAB — CBC WITH DIFFERENTIAL/PLATELET
BASOS PCT: 0 %
Basophils Absolute: 0 10*3/uL (ref 0.0–0.1)
EOS PCT: 1 %
Eosinophils Absolute: 0 10*3/uL (ref 0.0–0.7)
HCT: 39 % (ref 36.0–46.0)
Hemoglobin: 13.4 g/dL (ref 12.0–15.0)
Lymphocytes Relative: 17 %
Lymphs Abs: 1 10*3/uL (ref 0.7–4.0)
MCH: 32.9 pg (ref 26.0–34.0)
MCHC: 34.4 g/dL (ref 30.0–36.0)
MCV: 95.8 fL (ref 78.0–100.0)
MONO ABS: 0.3 10*3/uL (ref 0.1–1.0)
Monocytes Relative: 5 %
Neutro Abs: 4.4 10*3/uL (ref 1.7–7.7)
Neutrophils Relative %: 77 %
PLATELETS: 264 10*3/uL (ref 150–400)
RBC: 4.07 MIL/uL (ref 3.87–5.11)
RDW: 12.8 % (ref 11.5–15.5)
WBC: 5.8 10*3/uL (ref 4.0–10.5)

## 2017-08-22 LAB — URINALYSIS, ROUTINE W REFLEX MICROSCOPIC
Bilirubin Urine: NEGATIVE
GLUCOSE, UA: NEGATIVE mg/dL
Hgb urine dipstick: NEGATIVE
Ketones, ur: NEGATIVE mg/dL
LEUKOCYTES UA: NEGATIVE
Nitrite: NEGATIVE
PH: 5.5 (ref 5.0–8.0)
Protein, ur: NEGATIVE mg/dL
Specific Gravity, Urine: <1.005 — ABNORMAL LOW (ref 1.005–1.030)

## 2017-08-22 LAB — TROPONIN I: Troponin I: <0.03 ng/mL (ref <0.03)

## 2017-08-22 MED ORDER — VALACYCLOVIR HCL 500 MG PO TABS
1000.0000 mg | ORAL_TABLET | Freq: Once | ORAL | Status: AC
  Administered 2017-08-22: 1000 mg via ORAL
  Filled 2017-08-22: qty 2

## 2017-08-22 MED ORDER — VALACYCLOVIR HCL 1 G PO TABS: 1000.0000 mg | ORAL_TABLET | Freq: Three times a day (TID) | ORAL | 0 refills | Status: AC

## 2017-08-22 NOTE — ED Notes (Signed)
ED Provider at bedside. 

## 2017-08-22 NOTE — ED Triage Notes (Addendum)
Painful rash to the R thoracic back x 3 days. Also c/o upper abd pain.

## 2017-08-22 NOTE — Discharge Instructions (Signed)
We suspect the rash and discomfort you are feeling is related to the likely shingles outbreak.  We gave you a dose of the antiviral and please continue taking it for the next week 3 times a day.  Please follow-up with your primary care physician in several days for reassessment.  If any symptoms change or worsen, please return to the nearest emergency department.

## 2017-08-22 NOTE — ED Provider Notes (Signed)
[facility] Provider Note   CSN: [id number] Arrival date & time: 08/22/17  0160     History   Chief Complaint Chief Complaint  Patient presents with  . Rash  . Abdominal Pain    HPI Georgiann Neider is a 72 y.o. female.  The history is provided by the patient and the spouse.  Rash   This is a new problem. The current episode started more than 2 days ago. The problem has been gradually worsening. The problem is associated with nothing. There has been no fever. The rash is present on the torso and back. The pain is mild. The pain has been constant since onset. Associated symptoms include blisters, itching and pain. She has tried nothing for the symptoms. The treatment provided no relief.    History reviewed. No pertinent past medical history.  Patient Active Problem List   Diagnosis Date Noted  . Right knee injury, subsequent encounter 07/01/2016    History reviewed. No pertinent surgical history.   OB History   None      Home Medications    Prior to Admission medications   Medication Sig Start Date End Date Taking? Authorizing Provider  DOCOSAHEXAENOIC ACID PO Take by mouth.    [provider]  Multiple Vitamins-Minerals (MULTIVITAMIN WITH MINERALS) tablet Take by mouth.    [provider]  vitamin C (ASCORBIC ACID) 500 MG tablet Take 500 mg by mouth.    [provider]    Family History No family history on file.  Social History Social History   Tobacco Use  . Smoking status: Never Smoker  . Smokeless tobacco: Never Used  Substance Use Topics  . Alcohol use: Not Currently    Alcohol/week: 8.4 oz    Types: 14 Glasses of wine per week  . Drug use: No     Allergies   Patient has no known allergies.   Review of Systems Review of Systems  Constitutional: Negative for chills, fatigue and fever.  HENT: Negative for congestion and rhinorrhea.   Eyes: Negative for visual disturbance.  Respiratory:  Negative for cough, chest tightness, shortness of breath, wheezing and stridor.   Cardiovascular: Positive for chest pain. Negative for palpitations.  Gastrointestinal: Positive for abdominal pain. Negative for constipation, diarrhea, nausea and vomiting.  Genitourinary: Negative for dysuria, flank pain and frequency.  Musculoskeletal: Positive for back pain. Negative for neck pain and neck stiffness.  Skin: Positive for itching and rash. Negative for wound.  Neurological: Negative for dizziness, light-headedness and headaches.  Psychiatric/Behavioral: Negative for agitation and confusion.  All other systems reviewed and are negative.    Physical Exam Updated Vital Signs BP 138/90 (BP Location: Left Arm)   Pulse 82   Temp 97.7 F (36.5 C) (Oral)   Resp 16   Ht 5\' 3"  (1.6 m)   Wt 59 kg (130 lb)   SpO2 100%   BMI 23.03 kg/m   Physical Exam  Constitutional: She is oriented to person, place, and time. She appears well-developed and well-nourished.  Non-toxic appearance. She does not appear ill. No distress.  HENT:  Nose: Nose normal.  Mouth/Throat: Oropharynx is clear and moist. No oropharyngeal exudate.  Eyes: Pupils are equal, round, and reactive to light. Conjunctivae and EOM are normal.  Neck: Normal range of motion.  Cardiovascular: Normal rate and intact distal pulses.  No murmur heard. Pulmonary/Chest: Effort normal and breath sounds normal. No respiratory distress. She has no wheezes. She has no rales. She exhibits  no tenderness.  Abdominal: Soft. Bowel sounds are normal. She exhibits no distension. There is no guarding.  Musculoskeletal: Normal range of motion. She exhibits no edema or tenderness.       Thoracic back: She exhibits pain.       Back:  Rash on R back torso wrapping around R side.   Lymphadenopathy:    She has no cervical adenopathy.  Neurological: She is alert and oriented to person, place, and time. No sensory deficit. She exhibits normal muscle tone.    Skin: Skin is warm. Rash noted. She is not diaphoretic. No erythema.  Psychiatric: She has a normal mood and affect.  Nursing note and vitals reviewed.    ED Treatments / Results  Labs (all labs ordered are listed, but only abnormal results are displayed) Labs Reviewed  COMPREHENSIVE METABOLIC PANEL - Abnormal; Notable for the following components:      Result Value   Glucose, Bld 118 (*)    BUN 21 (*)    ALT 13 (*)    Alkaline Phosphatase 36 (*)    All other components within normal limits  URINALYSIS, ROUTINE W REFLEX MICROSCOPIC - Abnormal; Notable for the following components:   Specific Gravity, Urine <1.005 (*)    All other components within normal limits  URINE CULTURE  LIPASE, BLOOD  CBC WITH DIFFERENTIAL/PLATELET  TROPONIN I    EKG EKG Interpretation  Date/Time:  Sunday August 22 2017 10:03:51 EDT Ventricular Rate:  82 PR Interval:    QRS Duration: 91 QT Interval:  370 QTC Calculation: 433 R Axis:   74 Text Interpretation:  Sinus rhythm Atrial premature complex When compared to prior, no significant changes seen.  No STEMI Confirmed by Antony Blackbird [phone]) on 08/22/2017 10:14:32 AM Also confirmed by Antony Blackbird [phone]), editor Moshe Cipro, Jeannetta Nap [phone])  on 08/22/2017 11:09:10 AM   Radiology Dg Ribs Unilateral W/chest Right  Result Date: 08/22/2017 CLINICAL DATA:  Audible pop involving the RIGHT ribs when bending approximately 1 month ago, with improvement in pain in the interval. However, the patient may have shingles in the RIGHT side of the chest. EXAM: RIGHT RIBS AND CHEST - 3+ VIEW COMPARISON:  Chest x-ray 07/13/2015.  No prior rib imaging. FINDINGS: Site of maximum pain and tenderness was marked with a small metallic BB. No fractures identified involving the LOWER RIGHT ribs in the area of maximum pain and tenderness. Osseous demineralization is present, and there are multiple compression fractures involving the thoracic spine including T6, T7, T8, T9, all of  which appear old. Cardiac silhouette normal in size, unchanged. Thoracic aorta mildly tortuous and atherosclerotic, unchanged. Hilar and mediastinal contours otherwise unremarkable. Lungs clear. Bronchovascular markings normal. Pulmonary vascularity normal. No visible pleural effusions. No pneumothorax. IMPRESSION: 1. No RIGHT rib fracture identified. 2.  No acute cardiopulmonary disease. 3. Osseous demineralization with multiple thoracic compression fractures which do not appear acute. Electronically Signed   By: Evangeline Dakin M.D.   On: 08/22/2017 10:50    Procedures Procedures (including critical care time)  Medications Ordered in ED Medications  valACYclovir (VALTREX) tablet 1,000 mg (1,000 mg Oral Given 08/22/17 1423)     Initial Impression / Assessment and Plan / ED Course  I have reviewed the triage vital signs and the nursing notes.  Pertinent labs & imaging results that were available during my care of the patient were reviewed by me and considered in my medical decision making (see chart for details).     Nakiah Osgood is a 72  y.o. female with no significant past medical history who presents with right back pain, right thoracic pain, upper abdominal pain, and rash developing with itching on the right torso.  Patient says that she had a right chest wall injury last month and thought she might have broken a rib.  She reports having intermittent pain since that time that is somewhat improving.  She reports that over the last few days she has had pain develop in the right back radiating around the right chest towards the right abdomen.  She reports that the rash and itching has developed on the back going around the right chest.  She denies any history of shingles but does report a history of having chickenpox as a child.  She denies nausea, vomiting, conservation, or diarrhea.  She denies any other abdominal trauma.  She denies any  dysuria but does report urinary frequency in the mornings  recently.  She denies any other chest pain, shortness of breath, or lightheadedness.  She denies palpitations currently.    On exam, patient has a rash in the right back that does appear to be erythematous and have several vesicles.  This appears to be a developing shingles rash.  Suspect this is the cause the patient's symptoms however, patient will obtain x-rays to rule out traumatic injuries from the right chest wall injury and rib fracture.  Patient also screen laboratory testing given the abdominal discomfort.  I suspect the abdominal discomfort is the shingles pain rating around the side.    If workup is reassuring, anticipate patient will be discharged home with prescription for pain medication and antiviral.  Next  Patient's diagnostic laboratory testing and imaging was overall reassuring.  No evidence of intra-abdominal pathology on lab testing and chest x-ray showed no pneumonia or pulmonary contusion.  No rib fracture was seen.  Suspect patient is having her symptoms related to the likely shingles outbreak.    Patient given a dose of Valtrex and will be discharged with valacyclovir prescription.  Patient will follow-up with PCP in several days.  Patient was offered pain medicine however she refused.  Patient would rather take over-the-counter medications.    Patient understood instructions to follow-up with PCP as well as return precautions.  Patient other worsens or concerns and was discharged in good condition.    Final Clinical Impressions(s) / ED Diagnoses   Final diagnoses:  Herpes zoster with complication  Rash of back    ED Discharge Orders        Ordered    valACYclovir (VALTREX) 1000 MG tablet  3 times daily     08/22/17 1357      Clinical Impression: 1. Herpes zoster with complication   2. Rash of back     Disposition: Discharge  Condition: Good  I have discussed the results, Dx and Tx plan with the pt(& family if present). He/she/they expressed  understanding and agree(s) with the plan. Discharge instructions discussed at great length. Strict return precautions discussed and pt &/or family have verbalized understanding of the instructions. No further questions at time of discharge.    Discharge Medication List as of 08/22/2017  1:58 PM    START taking these medications   Details  valACYclovir (VALTREX) 1000 MG tablet Take 1 tablet (1,000 mg total) by mouth 3 (three) times daily for 7 days., Starting Sun 08/22/2017, Until Sun 08/29/2017, Print        Follow Up: Augusto Garbe, [place] [id number] [phone]  [facility] [address] [id number] mc [address] [place] [phone]       Tegeler, Gwenyth Allegra, MD 08/22/17 Despina Pole

## 2017-08-23 LAB — URINE CULTURE: Culture: NO GROWTH

## 2018-01-26 IMAGING — CR DG KNEE COMPLETE 4+V*R*
4 series · 4 of 4 positions shown · non-contrast
Comparison: None.

CLINICAL DATA: Fall yesterday. Hit right knee on concrete. Anterior
knee pain with swelling. Unable to bear weight.

EXAM:
RIGHT KNEE - COMPLETE 4+ VIEW

[t knee ap right]
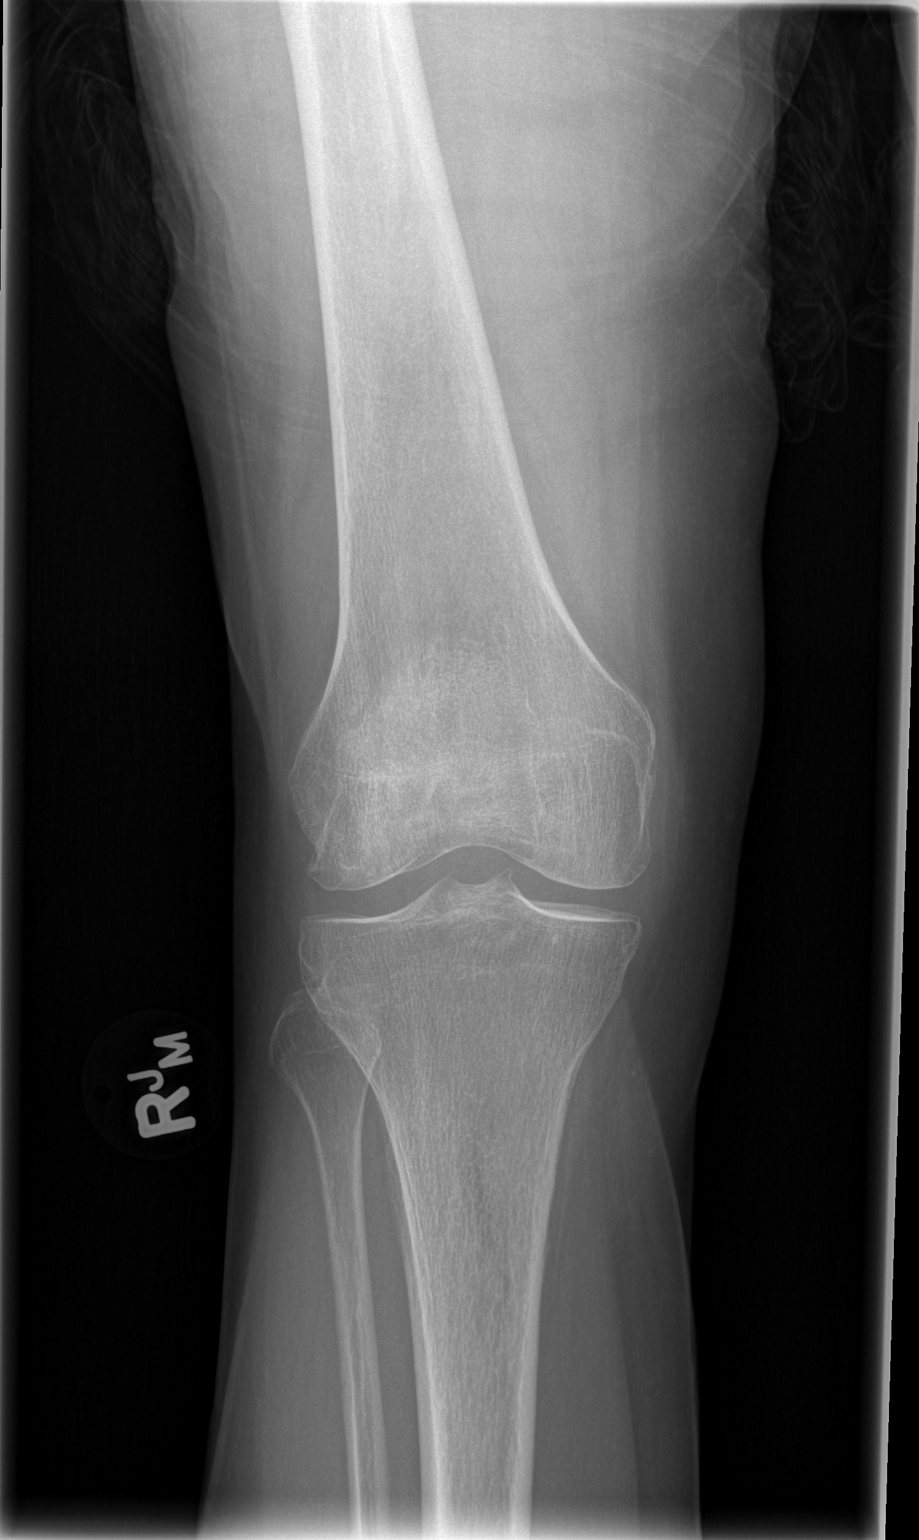

[t knee oblique right (1 of 2)]
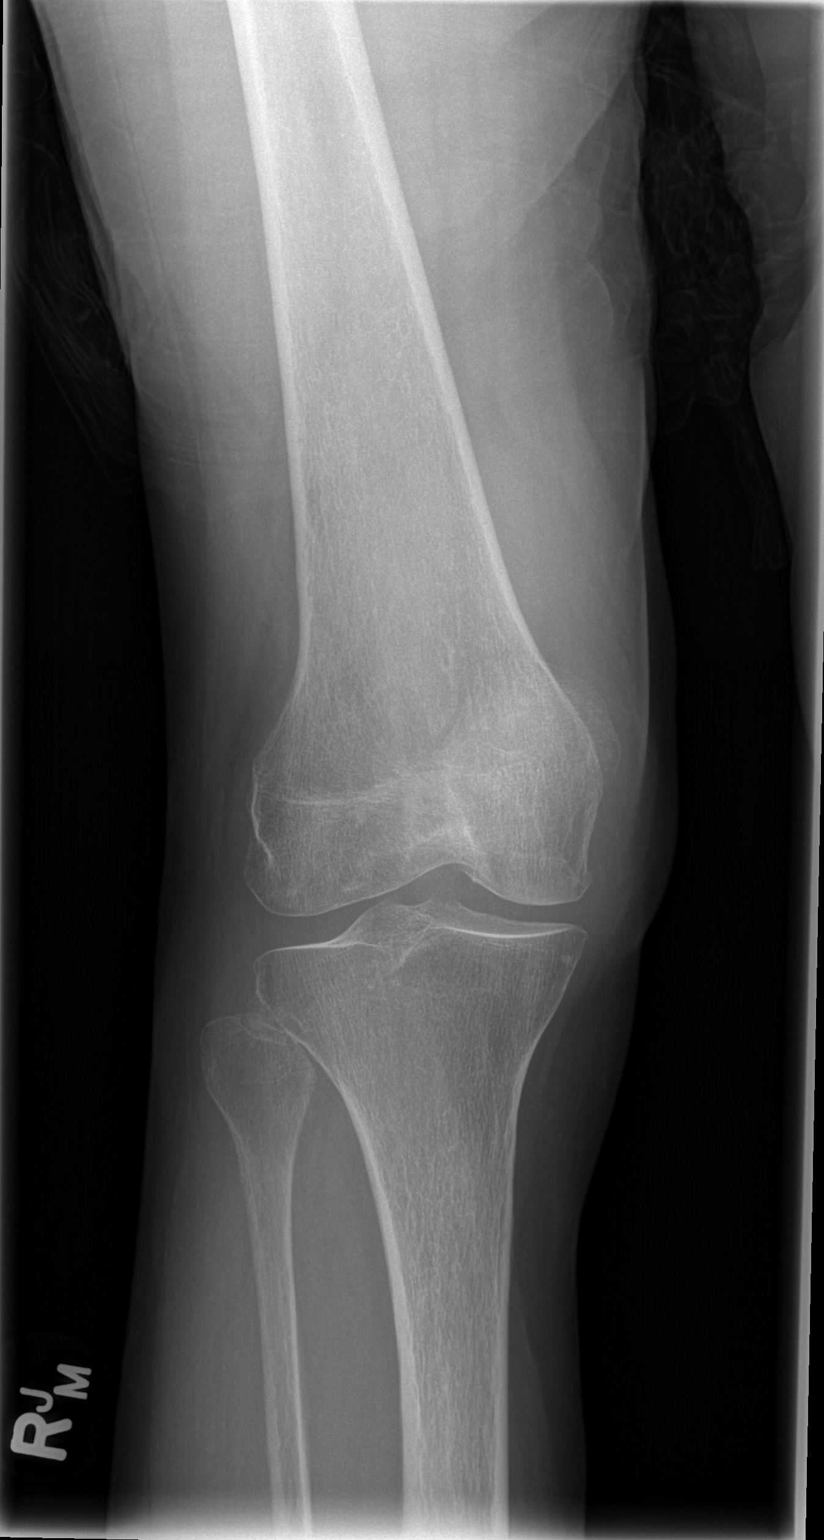

[t knee oblique right (2 of 2)]
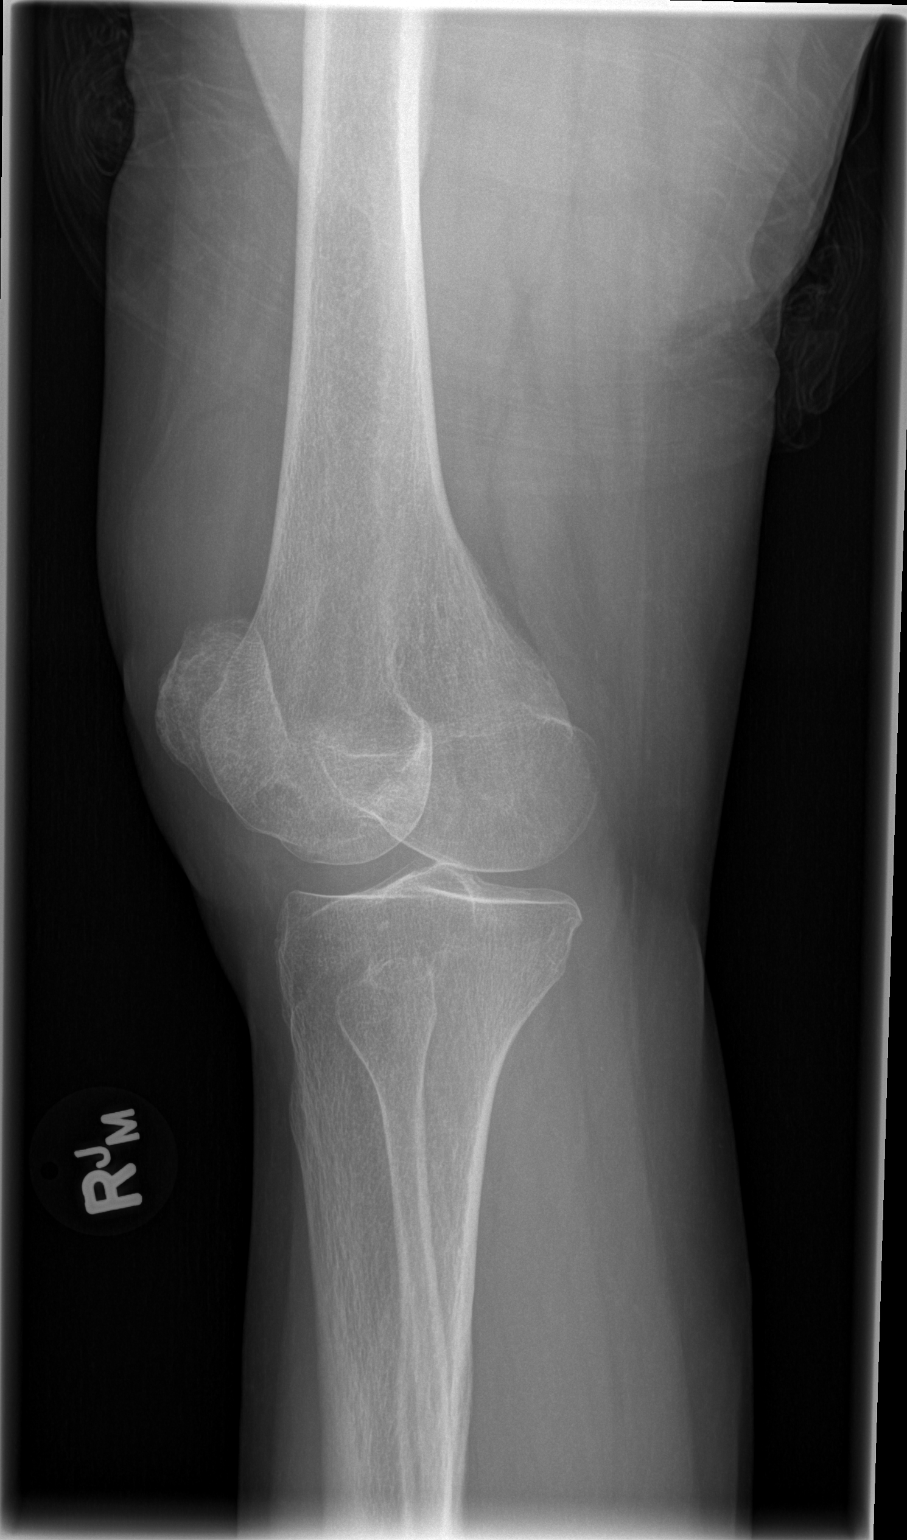

[t knee lat right]
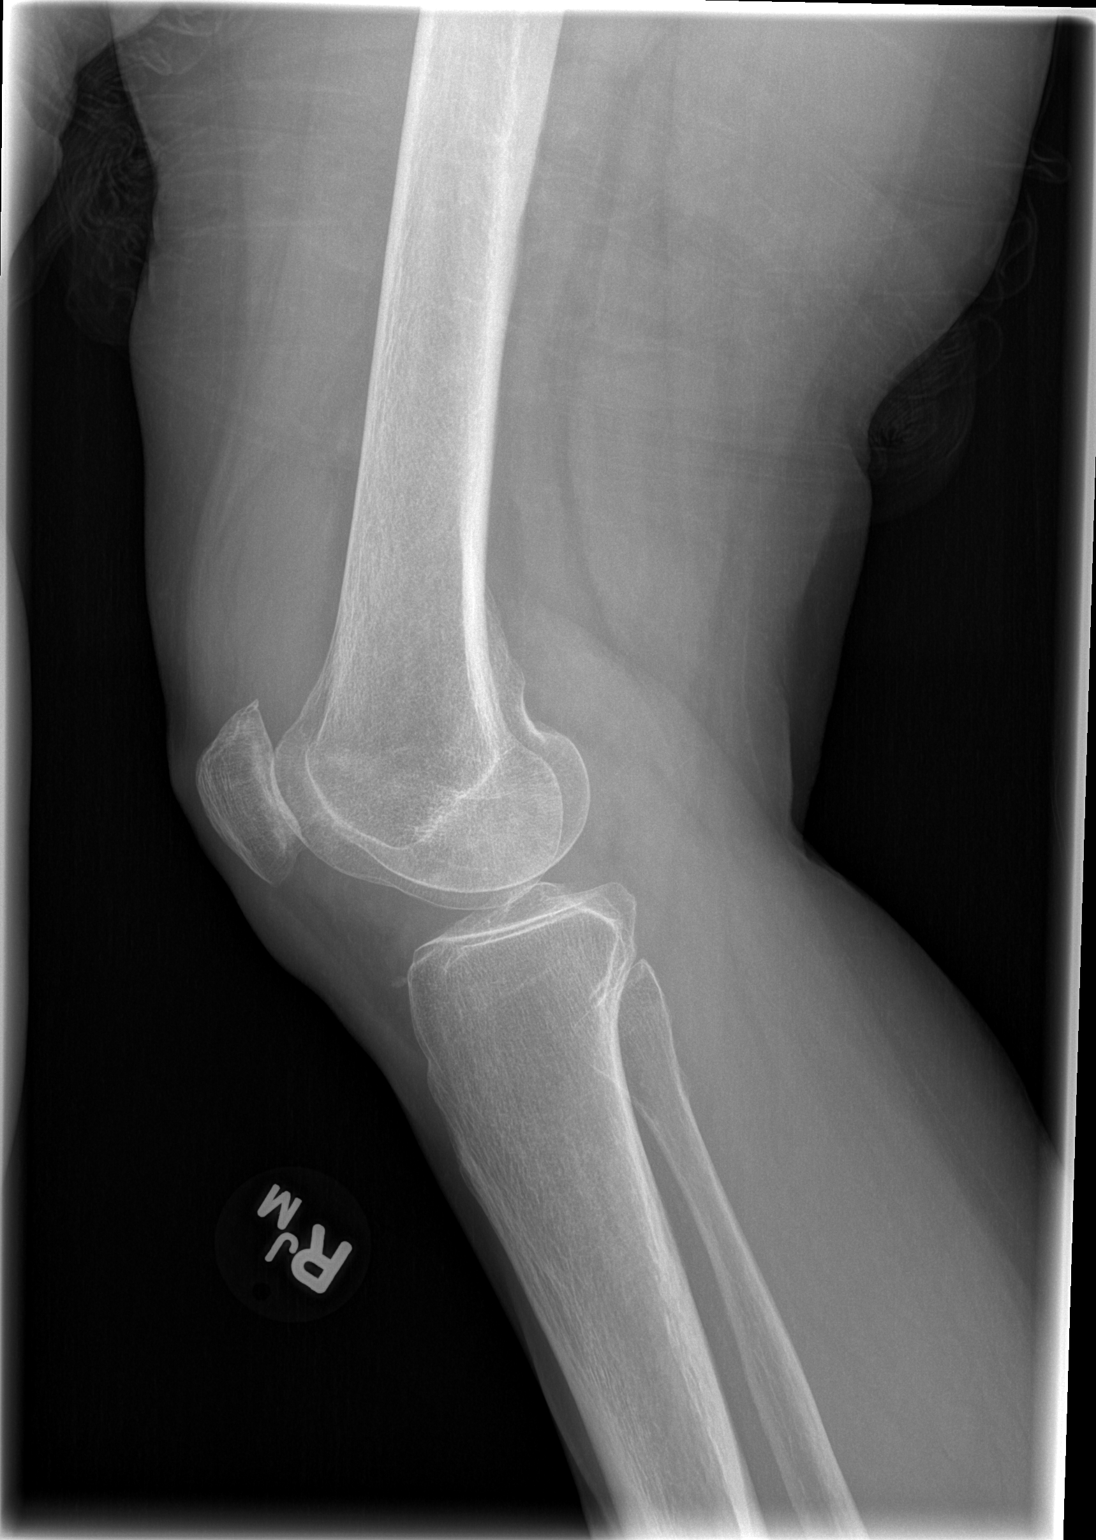

[4 of 4 positions shown; findings below may reference images not displayed]

FINDINGS: The right knee is located. A large joint effusion is present. There
is no underlying fracture. Moderate osteopenia is present.
IMPRESSION: 1. Large right knee effusion.  Internal derangement is not excluded.
2. No definite fracture.
3. Moderate osteopenia.

## 2021-06-15 ENCOUNTER — Emergency Department (HOSPITAL_BASED_OUTPATIENT_CLINIC_OR_DEPARTMENT_OTHER)
Admission: EM | Admit: 2021-06-15 | Discharge: 2021-06-15 | Disposition: A | Discharge status: Home / Self Care | Payer: Medicare Other | Attending: Emergency Medicine | Admitting: Emergency Medicine

## 2021-06-15 ENCOUNTER — Encounter (HOSPITAL_BASED_OUTPATIENT_CLINIC_OR_DEPARTMENT_OTHER): Payer: Self-pay | Admitting: Emergency Medicine

## 2021-06-15 ENCOUNTER — Other Ambulatory Visit: Payer: Self-pay

## 2021-06-15 DIAGNOSIS — H60501 Unspecified acute noninfective otitis externa, right ear: Secondary | ICD-10-CM

## 2021-06-15 DIAGNOSIS — H9201 Otalgia, right ear: Secondary | ICD-10-CM | POA: Diagnosis present

## 2021-06-15 DIAGNOSIS — H60511 Acute actinic otitis externa, right ear: Secondary | ICD-10-CM | POA: Diagnosis not present

## 2021-06-15 DIAGNOSIS — Z79899 Other long term (current) drug therapy: Secondary | ICD-10-CM | POA: Diagnosis not present

## 2021-06-15 HISTORY — DX: Malignant (primary) neoplasm, unspecified: C80.1

## 2021-06-15 MED ORDER — NEOMYCIN-POLYMYXIN-HC 3.5-10000-1 OT SUSP: 4.0000 [drp] | Freq: Three times a day (TID) | OTIC | 0 refills | Status: AC

## 2021-06-15 NOTE — ED Provider Notes (Signed)
[facility] EM[facility]PARTMENT Provider Note   CSN: [id number] Arrival date & time: 06/15/21  1103     History  Chief Complaint  Patient presents with   Otalgia    Sophia Atkinson is a 76 y.o. female.  The history is provided by the patient.  Otalgia Location:  Right Behind ear:  No abnormality Quality:  Aching Severity:  Mild Onset quality:  Gradual Duration:  2 days Timing:  Constant Progression:  Unchanged Chronicity:  New Context: water in ear   Relieved by:  Nothing Worsened by:  Nothing Associated symptoms: no fever, no headaches, no hearing loss, no rhinorrhea, no sore throat and no tinnitus       Home Medications Prior to Admission medications   Medication Sig Start Date End Date Taking? Authorizing Provider  neomycin-polymyxin-hydrocortisone (CORTISPORIN) 3.5-10000-1 OTIC suspension Place 4 drops into the right ear 3 (three) times daily for 7 days. 06/15/21 06/22/21 Yes Yvone Slape, DO  DOCOSAHEXAENOIC ACID PO Take by mouth.    [provider]  Multiple Vitamins-Minerals (MULTIVITAMIN WITH MINERALS) tablet Take by mouth.    [provider]  vitamin C (ASCORBIC ACID) 500 MG tablet Take 500 mg by mouth.    [provider]      Allergies    Patient has no known allergies.    Review of Systems   Review of Systems  Constitutional:  Negative for fever.  HENT:  Positive for ear pain. Negative for hearing loss, rhinorrhea, sore throat and tinnitus.   Neurological:  Negative for headaches.   Physical Exam Updated Vital Signs  ED Triage Vitals  Enc Vitals Group     BP 06/15/21 1115 (!) 166/99     Pulse Rate 06/15/21 1115 98     Resp 06/15/21 1115 18     Temp 06/15/21 1115 99.3 F (37.4 C)     Temp Source 06/15/21 1115 Oral     SpO2 06/15/21 1115 96 %     Weight 06/15/21 1114 138 lb (62.6 kg)     Height 06/15/21 1114 5\' 3"  (1.6 m)     Head Circumference --      Peak Flow --      Pain Score 06/15/21 1113 8     Pain  Loc --      Pain Edu? --      Excl. in [place]? --      Physical Exam Constitutional:      General: She is not in acute distress.    Appearance: She is not ill-appearing.  HENT:     Right Ear: Tympanic membrane normal.     Left Ear: Tympanic membrane and ear canal normal.     Ears:     Comments: Irritation of the external auditory canal on the right    Nose: Nose normal. No congestion.  Cardiovascular:     Pulses: Normal pulses.  Neurological:     Mental Status: She is alert.    ED Results / Procedures / Treatments   Labs (all labs ordered are listed, but only abnormal results are displayed) Labs Reviewed - No data to display  EKG None  Radiology No results found.  Procedures Procedures    Medications Ordered in ED Medications - No data to display  ED Course/ Medical Decision Making/ A&P                           Medical Decision Making Risk Prescription drug management.  Sophia Atkinson is here with right ear pain.  Got water in her ear and has some irritation ever since last 2 days.  She is currently undergoing chemotherapy for anal cancer.  She appears to have an otitis externa in the right ear.  There is no earwax.  Tympanic membrane's intact.  We will prescribe her antibiotics and have her follow-up with primary care doctor.  Discharged in good condition.  This chart was dictated using voice recognition software.  Despite best efforts to proofread,  errors can occur which can change the documentation meaning.         Final Clinical Impression(s) / ED Diagnoses Final diagnoses:  Acute otitis externa of right ear, unspecified type    Rx / DC Orders ED Discharge Orders          Ordered    neomycin-polymyxin-hydrocortisone (CORTISPORIN) 3.5-10000-1 OTIC suspension  3 times daily        06/15/21 1124              Ikhlas Albo, [place], DO 06/15/21 1126

## 2021-06-15 NOTE — ED Triage Notes (Signed)
Pt arrives pov with c/o right ear pain x 3 days. PT denies fever, currently receiving chemo for cancer. 400 mg ibuprofen pta

## 2023-06-14 ENCOUNTER — Emergency Department (HOSPITAL_BASED_OUTPATIENT_CLINIC_OR_DEPARTMENT_OTHER): Payer: Medicare Other

## 2023-06-14 ENCOUNTER — Encounter (HOSPITAL_BASED_OUTPATIENT_CLINIC_OR_DEPARTMENT_OTHER): Payer: Self-pay | Admitting: Emergency Medicine

## 2023-06-14 ENCOUNTER — Other Ambulatory Visit: Payer: Self-pay

## 2023-06-14 ENCOUNTER — Emergency Department (HOSPITAL_BASED_OUTPATIENT_CLINIC_OR_DEPARTMENT_OTHER)
Admission: EM | Admit: 2023-06-14 | Discharge: 2023-06-14 | Disposition: A | Discharge status: Home / Self Care | Payer: Medicare Other | Attending: Emergency Medicine | Admitting: Emergency Medicine

## 2023-06-14 DIAGNOSIS — R42 Dizziness and giddiness: Secondary | ICD-10-CM | POA: Insufficient documentation

## 2023-06-14 DIAGNOSIS — Z7901 Long term (current) use of anticoagulants: Secondary | ICD-10-CM | POA: Insufficient documentation

## 2023-06-14 DIAGNOSIS — X58XXXA Exposure to other specified factors, initial encounter: Secondary | ICD-10-CM | POA: Insufficient documentation

## 2023-06-14 DIAGNOSIS — Z8673 Personal history of transient ischemic attack (TIA), and cerebral infarction without residual deficits: Secondary | ICD-10-CM | POA: Insufficient documentation

## 2023-06-14 DIAGNOSIS — H9201 Otalgia, right ear: Secondary | ICD-10-CM | POA: Diagnosis present

## 2023-06-14 DIAGNOSIS — I10 Essential (primary) hypertension: Secondary | ICD-10-CM | POA: Insufficient documentation

## 2023-06-14 DIAGNOSIS — I6782 Cerebral ischemia: Secondary | ICD-10-CM | POA: Insufficient documentation

## 2023-06-14 DIAGNOSIS — T161XXA Foreign body in right ear, initial encounter: Secondary | ICD-10-CM | POA: Insufficient documentation

## 2023-06-14 NOTE — Discharge Instructions (Signed)
Today piece of cotton was removed from your right ear.  I think that was likely causing your right ear pain and likely subsequent dizziness due to inability of the ear to drain properly.  I would suspect your symptoms to get significantly better over the next 24 hours.  If for any reason you are dizziness dramatically changes for the worse or is accompanied by any strokelike symptoms including sensation changes, motor dysfunction, or difficulty speaking please return immediately to the emergency department.

## 2023-06-14 NOTE — ED Provider Notes (Signed)
[facility] Provider Note   CSN: [id number] Arrival date & time: 06/14/23  1245     History  Chief Complaint  Patient presents with   Dizziness   Otalgia    Sophia Atkinson is a 78 y.o. female.  Patient is a 78 year old female with past medical history as seen below presenting for dizziness and otalgia of the right ear.  Patient states over the last 3 days she has been having right ear pain without drainage.  History of prior history of swimmer's ear.  She has not been swimming lately.  She admits to room spinning dizziness over the last 3 days as well.  She denies any sensation or motor dysfunction.  Denies any slurred speech or difficulty speaking.  She is on Eliquis for previous strokes but denies any recent falls or head trauma.  The history is provided by the patient. No language interpreter was used.  Dizziness Associated symptoms: no headaches and no weakness   Otalgia Associated symptoms: no fever and no headaches        Home Medications Prior to Admission medications   Medication Sig Start Date End Date Taking? Authorizing Provider  DOCOSAHEXAENOIC ACID PO Take by mouth.    [provider]  Multiple Vitamins-Minerals (MULTIVITAMIN WITH MINERALS) tablet Take by mouth.    [provider]  vitamin C (ASCORBIC ACID) 500 MG tablet Take 500 mg by mouth.    [provider]      Allergies    Patient has no known allergies.    Review of Systems   Review of Systems  Constitutional:  Negative for chills and fever.  HENT:  Positive for ear pain.   Neurological:  Positive for dizziness. Negative for facial asymmetry, speech difficulty, weakness, light-headedness and headaches.    Physical Exam Updated Vital Signs BP (!) 174/100 (BP Location: Left Arm)   Pulse 92   Temp 98.1 F (36.7 C) (Oral)   Resp 16   Ht 5\' 3"  (1.6 m)   Wt 63.5 kg   SpO2 97%   BMI 24.80 kg/m  Physical Exam Constitutional:       Appearance: Normal appearance.  HENT:     Head: Normocephalic.     Left Ear: Tympanic membrane and ear canal normal.     Ears:     Comments: Foreign body in right ear-cotton distal one third of ear canal.  TM visualized after foreign body was removed with normal TM. Cardiovascular:     Rate and Rhythm: Normal rate.  Pulmonary:     Effort: Pulmonary effort is normal.  Neurological:     Mental Status: She is alert.     ED Results / Procedures / Treatments   Labs (all labs ordered are listed, but only abnormal results are displayed) Labs Reviewed - No data to display  EKG EKG Interpretation Date/Time:  Monday June 14 2023 13:12:50 EST Ventricular Rate:  88 PR Interval:  130 QRS Duration:  98 QT Interval:  357 QTC Calculation: 432 R Axis:   31  Text Interpretation: Sinus rhythm Confirmed by Alona Bene [phone]) on 06/14/2023 1:34:34 PM  Radiology CT Head Wo Contrast Result Date: 06/14/2023 CLINICAL DATA:  Dizziness and vertigo.  Headache. EXAM: CT HEAD WITHOUT CONTRAST TECHNIQUE: Contiguous axial images were obtained from the base of the skull through the vertex without intravenous contrast. RADIATION DOSE REDUCTION: This exam was performed according to the departmental dose-optimization program which includes automated exposure control, adjustment of  the mA and/or kV according to patient size and/or use of iterative reconstruction technique. COMPARISON:  None Available. FINDINGS: Brain: Mild age-related atrophy and chronic microvascular ischemic changes. There is no acute intracranial hemorrhage. No mass effect or midline shift. No extra-axial fluid collection. Vascular: No hyperdense vessel or unexpected calcification. Skull: Normal. Negative for fracture or focal lesion. Sinuses/Orbits: No acute finding. Other: None IMPRESSION: 1. No acute intracranial pathology. 2. Mild age-related atrophy and chronic microvascular ischemic changes. Electronically Signed   By: Elgie Collard M.D.   On: 06/14/2023 13:31    Procedures .Foreign Body Removal  Date/Time: 06/14/2023 3:52 PM  Performed by: Franne Forts, DO Authorized by: Franne Forts, DO  Consent: Verbal consent obtained. Consent given by: patient Time out: Immediately prior to procedure a "time out" was called to verify the correct patient, procedure, equipment, support staff and site/side marked as required. Body area: ear Location details: right ear  Sedation: Patient sedated: no  Patient cooperative: yes Localization method: Visualized with otoscope. Removal mechanism: alligator forceps Complexity: simple 1 objects recovered. Objects recovered: cotton Post-procedure assessment: foreign body removed Patient tolerance: patient tolerated the procedure well with no immediate complications      Medications Ordered in ED Medications - No data to display  ED Course/ Medical Decision Making/ A&P                                 Medical Decision Making  78 year old female with past medical history as seen below presenting for dizziness and otalgia of the right ear.  Patient is alert and oriented x 3, no acute distress, afebrile, so vital signs.  Physical exam demonstrates foreign body in right ear.  Foreign body was a piece of cotton that was extracted from the distal one third of her ear canal.  I suspect that the foreign body created some type of fluid imbalance behind her TM that resulted in her dizziness.  I recommend that she takes a Benadryl tonight prior to bed.  No otitis media.  Left ear canal normal.  No signs or symptoms of strokelike symptoms on exam.  NIH stroke scale 0.  Patient did receive a CT head prior to my evaluation, as ordered by triage process, and is normal.  Patient also has hypertension on arrival at 174/100.  Patient has a known diagnosis of whitecoat hypertension and has taken her blood pressure several times at home with stable blood pressures.  She is recommended  follow closely with her primary care physician for blood pressure as that can also result in dizziness.  Believe patient's symptoms to be due to to her foreign body in her ear.  At this time she is safe to return home and follow closely with her primary care physician.  I also told her to return to the emergency department immediately if the dizziness does not begin to improve or if it dramatically changes. Patient in no distress and overall condition improved here in the ED. Detailed discussions were had with the patient regarding current findings, and need for close f/u with PCP or on call doctor. The patient has been instructed to return immediately if the symptoms worsen in any way for re-evaluation. Patient verbalized understanding and is in agreement with current care plan. All questions answered prior to discharge.        Final Clinical Impression(s) / ED Diagnoses Final diagnoses:  Foreign body of right ear, initial  encounter  Right ear pain    Rx / DC Orders ED Discharge Orders     None         Franne Forts, DO 06/14/23 1555

## 2023-06-14 NOTE — ED Triage Notes (Addendum)
Pt POV slow, steady gait with personal cane-   C/o dizziness x 3 days (room spinning), anterior headache, R ear pain (3 days)  Pt takes eliquis for hx of PE. Stroke screen neg. EDP Alex made aware.
